# Patient Record
Sex: Female | Born: 1985 | Race: White | Hispanic: No | Marital: Single | Smoking: Never smoker
Health system: Southern US, Community
[De-identification: ages and names within clinical notes are randomized; demographics above are authoritative.]

## PROBLEM LIST (undated history)

## (undated) DIAGNOSIS — R51 Headache: Secondary | ICD-10-CM

## (undated) DIAGNOSIS — R519 Headache, unspecified: Secondary | ICD-10-CM

## (undated) HISTORY — DX: Headache: R51

## (undated) HISTORY — DX: Headache, unspecified: R51.9

## (undated) HISTORY — PX: LIPOMA EXCISION: SHX5283

---
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
## 2013-09-20 ENCOUNTER — Encounter (HOSPITAL_COMMUNITY): Payer: Self-pay | Admitting: Emergency Medicine

## 2013-09-20 ENCOUNTER — Emergency Department (HOSPITAL_COMMUNITY)
Admission: EM | Admit: 2013-09-20 | Discharge: 2013-09-20 | Disposition: A | Discharge status: Home / Self Care | Payer: BC Managed Care – PPO | Attending: Emergency Medicine | Admitting: Emergency Medicine

## 2013-09-20 ENCOUNTER — Emergency Department (HOSPITAL_COMMUNITY): Payer: BC Managed Care – PPO

## 2013-09-20 DIAGNOSIS — S2220XA Unspecified fracture of sternum, initial encounter for closed fracture: Secondary | ICD-10-CM

## 2013-09-20 DIAGNOSIS — S92919A Unspecified fracture of unspecified toe(s), initial encounter for closed fracture: Secondary | ICD-10-CM | POA: Insufficient documentation

## 2013-09-20 DIAGNOSIS — M25549 Pain in joints of unspecified hand: Secondary | ICD-10-CM | POA: Diagnosis not present

## 2013-09-20 DIAGNOSIS — M542 Cervicalgia: Secondary | ICD-10-CM | POA: Insufficient documentation

## 2013-09-20 DIAGNOSIS — S42013A Anterior displaced fracture of sternal end of unspecified clavicle, initial encounter for closed fracture: Secondary | ICD-10-CM | POA: Insufficient documentation

## 2013-09-20 DIAGNOSIS — IMO0001 Reserved for inherently not codable concepts without codable children: Secondary | ICD-10-CM | POA: Diagnosis not present

## 2013-09-20 DIAGNOSIS — S298XXA Other specified injuries of thorax, initial encounter: Secondary | ICD-10-CM | POA: Diagnosis present

## 2013-09-20 DIAGNOSIS — I499 Cardiac arrhythmia, unspecified: Secondary | ICD-10-CM | POA: Insufficient documentation

## 2013-09-20 DIAGNOSIS — R109 Unspecified abdominal pain: Secondary | ICD-10-CM | POA: Insufficient documentation

## 2013-09-20 DIAGNOSIS — Y939 Activity, unspecified: Secondary | ICD-10-CM | POA: Diagnosis not present

## 2013-09-20 DIAGNOSIS — S301XXA Contusion of abdominal wall, initial encounter: Secondary | ICD-10-CM | POA: Insufficient documentation

## 2013-09-20 LAB — I-STAT CHEM 8, ED
BUN: 6 mg/dL (ref 6–23)
CREATININE: 0.7 mg/dL (ref 0.50–1.10)
Calcium, Ion: 1.15 mmol/L (ref 1.12–1.23)
Chloride: 107 mEq/L (ref 96–112)
GLUCOSE: 104 mg/dL — AB (ref 70–99)
HCT: 41 % (ref 36.0–46.0)
Hemoglobin: 13.9 g/dL (ref 12.0–15.0)
Potassium: 3.6 mEq/L — ABNORMAL LOW (ref 3.7–5.3)
Sodium: 143 mEq/L (ref 137–147)
TCO2: 21 mmol/L (ref 0–100)

## 2013-09-20 LAB — HEPATIC FUNCTION PANEL
ALK PHOS: 77 U/L (ref 39–117)
ALT: 27 U/L (ref 0–35)
AST: 22 U/L (ref 0–37)
Albumin: 3.7 g/dL (ref 3.5–5.2)
BILIRUBIN TOTAL: 0.3 mg/dL (ref 0.3–1.2)
Total Protein: 6.8 g/dL (ref 6.0–8.3)

## 2013-09-20 LAB — I-STAT TROPONIN, ED: Troponin i, poc: 0 ng/mL (ref 0.00–0.08)

## 2013-09-20 LAB — CBC WITH DIFFERENTIAL/PLATELET
Basophils Absolute: 0 10*3/uL (ref 0.0–0.1)
Basophils Relative: 0 % (ref 0–1)
EOS ABS: 0 10*3/uL (ref 0.0–0.7)
EOS PCT: 0 % (ref 0–5)
HEMATOCRIT: 41 % (ref 36.0–46.0)
HEMOGLOBIN: 14.3 g/dL (ref 12.0–15.0)
LYMPHS ABS: 0.8 10*3/uL (ref 0.7–4.0)
Lymphocytes Relative: 10 % — ABNORMAL LOW (ref 12–46)
MCH: 31.3 pg (ref 26.0–34.0)
MCHC: 34.9 g/dL (ref 30.0–36.0)
MCV: 89.7 fL (ref 78.0–100.0)
MONO ABS: 0.6 10*3/uL (ref 0.1–1.0)
MONOS PCT: 8 % (ref 3–12)
Neutro Abs: 6.4 10*3/uL (ref 1.7–7.7)
Neutrophils Relative %: 82 % — ABNORMAL HIGH (ref 43–77)
Platelets: 195 10*3/uL (ref 150–400)
RBC: 4.57 MIL/uL (ref 3.87–5.11)
RDW: 12.3 % (ref 11.5–15.5)
WBC: 7.8 10*3/uL (ref 4.0–10.5)

## 2013-09-20 MED ORDER — IOHEXOL 350 MG/ML SOLN
80.0000 mL | Freq: Once | INTRAVENOUS | Status: AC | PRN
  Administered 2013-09-20: 80 mL via INTRAVENOUS

## 2013-09-20 MED ORDER — IBUPROFEN 600 MG PO TABS: 600.0000 mg | ORAL_TABLET | Freq: Four times a day (QID) | ORAL | Status: DC | PRN

## 2013-09-20 MED ORDER — SODIUM CHLORIDE 0.9 % IV SOLN
Freq: Once | INTRAVENOUS | Status: AC
  Administered 2013-09-20: 17:00:00 via INTRAVENOUS

## 2013-09-20 MED ORDER — KETOROLAC TROMETHAMINE 30 MG/ML IJ SOLN
30.0000 mg | Freq: Once | INTRAMUSCULAR | Status: AC
  Administered 2013-09-20: 30 mg via INTRAVENOUS
  Filled 2013-09-20: qty 1

## 2013-09-20 MED ORDER — OXYCODONE-ACETAMINOPHEN 5-325 MG PO TABS
1.0000 | ORAL_TABLET | Freq: Once | ORAL | Status: AC
  Administered 2013-09-20: 1 via ORAL
  Filled 2013-09-20: qty 1

## 2013-09-20 MED ORDER — OXYCODONE-ACETAMINOPHEN 5-325 MG PO TABS: 1.0000 | ORAL_TABLET | Freq: Four times a day (QID) | ORAL | Status: DC | PRN

## 2013-09-20 MED ORDER — ONDANSETRON HCL 4 MG/2ML IJ SOLN
4.0000 mg | Freq: Once | INTRAMUSCULAR | Status: AC
  Administered 2013-09-20: 4 mg via INTRAVENOUS
  Filled 2013-09-20: qty 2

## 2013-09-20 MED ORDER — HYDROMORPHONE HCL PF 1 MG/ML IJ SOLN
1.0000 mg | Freq: Once | INTRAMUSCULAR | Status: AC
  Administered 2013-09-20: 1 mg via INTRAVENOUS
  Filled 2013-09-20: qty 1
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
## 2013-09-20 NOTE — ED Notes (Signed)
Pt states she has chest pain.  
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
## 2013-09-20 NOTE — ED Notes (Signed)
toradol given im instead of iv per dr Wilkie Ayehorton

## 2013-09-20 NOTE — ED Notes (Signed)
Report given to Ben, RN
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
## 2013-09-20 NOTE — ED Notes (Signed)
Pt up to the br .  She reports that the percocet given did nothing for her pain

## 2013-09-21 NOTE — ED Provider Notes (Signed)
Medical screening examination/treatment/procedure(s) were conducted as a shared visit with non-physician practitioner(s) and myself.  I personally evaluated the patient during the encounter.   EKG Interpretation   Date/Time:  Thursday September 20 2013 17:39:06 EDT Ventricular Rate:  78 PR Interval:  120 QRS Duration: 86 QT Interval:  398 QTC Calculation: 453 R Axis:   64 Text Interpretation:  Sinus rhythm Confirmed by Wilkie AyeHORTON  MD, COURTNEY  [phone]) on 09/20/2013 5:54:14 PM        Shon Batonourtney F Horton, MD 09/21/13 1432
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
## 2015-09-22 ENCOUNTER — Telehealth: Payer: Self-pay | Admitting: Family Medicine

## 2015-09-22 ENCOUNTER — Ambulatory Visit (INDEPENDENT_AMBULATORY_CARE_PROVIDER_SITE_OTHER): Payer: BLUE CROSS/BLUE SHIELD | Admitting: Neurology

## 2015-09-22 ENCOUNTER — Encounter: Payer: Self-pay | Admitting: Neurology

## 2015-09-22 VITALS — BP 110/72 | HR 90 | Ht 60.0 in | Wt 153.0 lb

## 2015-09-22 DIAGNOSIS — H4710 Unspecified papilledema: Secondary | ICD-10-CM | POA: Diagnosis not present

## 2015-09-22 NOTE — Progress Notes (Signed)
NEUROLOGY CONSULTATION NOTE  ELYCE ZOLLINGER MRN: [id number] DOB: 1985-08-13  Referring provider: Ashok Cordia, OD Primary care provider: Gus Height, PA  Reason for consult:  papilledema  HISTORY OF PRESENT ILLNESS: Danielle White is a 30 year old right-handed female who presents for papilledema.  History obtained by patient and optometry note.  About 6 weeks ago, she began experiencing headaches.  They were posterior, mostly right-sided.  Normally, they were a pressure-like pain but would start throbbing if she bent forward.  Headaches were worse after laying down, such as when she woke up in the morning or from a nap.  She would also experience nosebleeds but no sinus congestion.  She also noted dizziness.  She noted ringing a couple of times but no pulsatile tinnitus.  She denied visual obscurations.  She denied nausea, photophobia or phonophobia.  She denied prior history of migraines.    She was examined by her optometrist on 09/01/15.  Exam revealed bilateral papilledema.  She reportedly had a CT of the head which was negative.  She has had the [facility] IUD for two years and had it removed 2 weeks ago.  The headaches have almost resolved.  PAST MEDICAL HISTORY: Past Medical History  Diagnosis Date  . Headache     PAST SURGICAL HISTORY: Past Surgical History  Procedure Laterality Date  . Lipoma excision      MEDICATIONS: No current outpatient prescriptions on file prior to visit.   No current facility-administered medications on file prior to visit.    ALLERGIES: Allergies  Allergen Reactions  . Penicillins     Unknown; childhood reaction    FAMILY HISTORY: Family History  Problem Relation Age of Onset  . Alcoholism Father   . Heart attack Father   . Scoliosis Father     SOCIAL HISTORY: Social History   Social History  . Marital Status: Single    Spouse Name: N/A  . Number of Children: 0  . Years of Education: N/A   Occupational History  .  Chiropratic Assistant    Social History Main Topics  . Smoking status: Never Smoker   . Smokeless tobacco: Never Used  . Alcohol Use: 0.0 oz/week    0 Standard drinks or equivalent per week     Comment: occassional  . Drug Use: No  . Sexual Activity: Not on file   Other Topics Concern  . Not on file   Social History Narrative    REVIEW OF SYSTEMS: Constitutional: No fevers, chills, or sweats, no generalized fatigue, change in appetite Eyes: No visual changes, double vision, eye pain Ear, nose and throat: No hearing loss, ear pain, nasal congestion, sore throat Cardiovascular: No chest pain, palpitations Respiratory:  No shortness of breath at rest or with exertion, wheezes GastrointestinaI: No nausea, vomiting, diarrhea, abdominal pain, fecal incontinence Genitourinary:  No dysuria, urinary retention or frequency Musculoskeletal:  No neck pain, back pain Integumentary: No rash, pruritus, skin lesions Neurological: as above Psychiatric: No depression, insomnia, anxiety Endocrine: No palpitations, fatigue, diaphoresis, mood swings, change in appetite, change in weight, increased thirst Hematologic/Lymphatic:  No anemia, purpura, petechiae. Allergic/Immunologic: no itchy/runny eyes, nasal congestion, recent allergic reactions, rashes  PHYSICAL EXAM: Filed Vitals:   09/22/15 1447  BP: 110/72  Pulse: 90   General: No acute distress.  Patient appears well-groomed.  Head:  Normocephalic/atraumatic Eyes:  fundi examined but not visualized Neck: supple, no paraspinal tenderness, full range of motion Back: No paraspinal tenderness Heart: regular rate and rhythm Lungs: Clear  to auscultation bilaterally. Vascular: No carotid bruits. Neurological Exam: Mental status: alert and oriented to person, place, and time, recent and remote memory intact, fund of knowledge intact, attention and concentration intact, speech fluent and not dysarthric, language intact. Cranial nerves: CN I:  not tested CN II: pupils equal, round and reactive to light, visual fields intact CN III, IV, VI:  full range of motion, no nystagmus, no ptosis CN V: facial sensation intact CN VII: upper and lower face symmetric CN VIII: hearing intact CN IX, X: gag intact, uvula midline CN XI: sternocleidomastoid and trapezius muscles intact CN XII: tongue midline Bulk & Tone: normal, no fasciculations. Motor:  5/5 throughout  Sensation: temperature and vibration sensation intact. Deep Tendon Reflexes:  2+ throughout, toes downgoing.  Finger to nose testing:  Without dysmetria.  Heel to shin:  Without dysmetria.  Gait:  Normal station and stride.  Able to turn and tandem walk. Romberg negative.  IMPRESSION: Papilledema.  It is possible that the IUD was causing the papilledema.  However, it may have been just causing the headache as well.  PLAN: 1.  We will get MRI of brain with and without contrast to rule out other causes of papilledema other than intracranial mass (such as demyelinating disease). 2.  I advise that she have her eyes re-examined in 2 weeks (about 4 weeks from when the IUD was removed).  If papilledema persists, we will proceed with lumbar puncture, measuring opening pressure.  If opening pressure is elevated, we would initiate acetazolamide. 3.  Follow up   Thank you for allowing me to take part in the care of this patient.  Shon MilletAdam Jaffe, DO  CC:  Ashok CordiaMaria Johnson, OD  Gus HeightAndrea Johnson, [place]

## 2015-09-22 NOTE — Telephone Encounter (Signed)
Called patient to make her aware that [facility] @ 10:45 am with a 10:30 am arrival.   Central scheduling [phone] opt [phone].

## 2015-09-22 NOTE — Patient Instructions (Signed)
1.  We will check MRI of brain with and without contrast 2.  I would like you re-evaluated by Dr. Laural BenesJohnson sooner than later, because if the papilledema persists, then we really need to move on to a spinal tap to measure the pressure. I recommend follow up in 2 weeks (around 4 weeks after IUD was removed).  3.  Have Dr. Laural BenesJohnson send me her notes and I will contact you with next steps. 4.  Follow up

## 2015-09-23 NOTE — Progress Notes (Signed)
Chart forwarded.  

## 2015-10-06 ENCOUNTER — Telehealth: Payer: Self-pay | Admitting: Neurology

## 2015-10-06 NOTE — Telephone Encounter (Signed)
Dr Laural BenesJohnson from [facility] called and needs to speak with you about patient please call [phone]

## 2015-10-07 ENCOUNTER — Telehealth: Payer: Self-pay

## 2015-10-07 DIAGNOSIS — H4710 Unspecified papilledema: Secondary | ICD-10-CM

## 2015-10-07 NOTE — Telephone Encounter (Signed)
Spoke with office. They will fax notes at 1 p.m. When the techs come back from lunch.

## 2015-10-07 NOTE — Telephone Encounter (Signed)
Left message for Dr. Laural BenesJohnson to call me back.  Could we contact [facility] for her notes on Danielle White?

## 2015-10-07 NOTE — Telephone Encounter (Signed)
-----   Message from Drema DallasAdam R Jaffe, DO sent at 10/07/2015  3:40 PM EDT ----- I spoke with Dr. Laural BenesJohnson.  Even though the papilledema is unchanged and very mild, I still believe it needs to be further investigated.  I would like to proceed with MRI of brain with and without contrast.  This would  be followed by the lumbar puncture.  We would check for opening pressure, cell count, glucose, and protein.  We can add other labs pending MRI results.

## 2015-10-08 NOTE — Telephone Encounter (Signed)
Pt stated she would like to speak to Dr. Laural BenesJohnson first. States at visit provider had scheduled MRI but then stated they would wait  A full 12 weeks to detect changes before deciding to have MRI. Pt states she is supposed to see Dr. Laural BenesJohnson today and will call back.

## 2015-10-14 NOTE — Telephone Encounter (Signed)
We can send a certified letter stating that we have been trying to contact her to see what she would like to do

## 2015-10-14 NOTE — Telephone Encounter (Signed)
Attempted to reach pt again. VM was full, could not leave message.

## 2015-10-14 NOTE — Telephone Encounter (Signed)
Letter composed, printed, signed, addressed, and given to Teddi to mail out as certified.

## 2015-10-14 NOTE — Telephone Encounter (Signed)
Attempted to reach pt again. VM still full and unable to leave message. Has been a week since last pt was advised that provider would like to proceed with MRI and LP, pt has yet to return call to let me know if she would like to go ahead with testing. Please advise.

## 2015-10-14 NOTE — Telephone Encounter (Signed)
Attempted to reach pt one last time. Was sent to VM, which was still full. Will draft and send letter.

## 2016-01-07 ENCOUNTER — Ambulatory Visit: Payer: BLUE CROSS/BLUE SHIELD | Admitting: Neurology

## 2016-01-07 DIAGNOSIS — Z029 Encounter for administrative examinations, unspecified: Secondary | ICD-10-CM
